# Patient Record
Sex: Male | Born: 1975 | Race: White | Hispanic: No | Marital: Single | Smoking: Current every day smoker
Health system: Southern US, Community
[De-identification: ages and names within clinical notes are randomized; demographics above are authoritative.]

## PROBLEM LIST (undated history)

## (undated) DIAGNOSIS — F419 Anxiety disorder, unspecified: Secondary | ICD-10-CM

---

## 2003-09-20 ENCOUNTER — Other Ambulatory Visit: Payer: Self-pay

## 2004-05-25 ENCOUNTER — Emergency Department: Payer: Self-pay | Admitting: Emergency Medicine

## 2004-12-02 ENCOUNTER — Other Ambulatory Visit: Payer: Self-pay

## 2004-12-02 ENCOUNTER — Inpatient Hospital Stay: Payer: Self-pay | Admitting: Internal Medicine

## 2005-08-17 ENCOUNTER — Emergency Department: Payer: Self-pay | Admitting: Emergency Medicine

## 2007-11-19 ENCOUNTER — Emergency Department: Payer: Self-pay | Admitting: Emergency Medicine

## 2007-11-22 ENCOUNTER — Emergency Department: Payer: Self-pay | Admitting: Emergency Medicine

## 2007-11-23 ENCOUNTER — Emergency Department: Payer: Self-pay | Admitting: Emergency Medicine

## 2008-07-05 ENCOUNTER — Emergency Department: Payer: Self-pay | Admitting: Emergency Medicine

## 2009-04-03 ENCOUNTER — Emergency Department: Payer: Self-pay | Admitting: Emergency Medicine

## 2009-09-20 ENCOUNTER — Emergency Department: Payer: Self-pay | Admitting: Emergency Medicine

## 2012-12-27 ENCOUNTER — Emergency Department: Payer: Self-pay | Admitting: Emergency Medicine

## 2012-12-27 LAB — URINALYSIS, COMPLETE
Bacteria: NONE SEEN
Bilirubin,UR: NEGATIVE
Ketone: NEGATIVE
Leukocyte Esterase: NEGATIVE
Protein: NEGATIVE
RBC,UR: NONE SEEN /HPF (ref 0–5)
Specific Gravity: 1.002 (ref 1.003–1.030)
Squamous Epithelial: <1

## 2012-12-27 LAB — GC/CHLAMYDIA PROBE AMP

## 2013-07-03 ENCOUNTER — Emergency Department: Payer: Self-pay | Admitting: Emergency Medicine

## 2013-07-03 LAB — CBC
HCT: 43.9 % (ref 40.0–52.0)
HGB: 14.2 g/dL (ref 13.0–18.0)
MCH: 32.1 pg (ref 26.0–34.0)
MCHC: 32.3 g/dL (ref 32.0–36.0)
MCV: 99 fL (ref 80–100)
Platelet: 198 10*3/uL (ref 150–440)
RBC: 4.42 10*6/uL (ref 4.40–5.90)
RDW: 13.2 % (ref 11.5–14.5)
WBC: 8.6 10*3/uL (ref 3.8–10.6)

## 2013-07-03 LAB — BASIC METABOLIC PANEL
Anion Gap: 7 (ref 7–16)
BUN: 6 mg/dL — AB (ref 7–18)
CALCIUM: 8.6 mg/dL (ref 8.5–10.1)
CHLORIDE: 103 mmol/L (ref 98–107)
CO2: 29 mmol/L (ref 21–32)
Creatinine: 0.72 mg/dL (ref 0.60–1.30)
EGFR (African American): >60
EGFR (Non-African Amer.): >60
GLUCOSE: 76 mg/dL (ref 65–99)
OSMOLALITY: 274 (ref 275–301)
Potassium: 3.5 mmol/L (ref 3.5–5.1)
Sodium: 139 mmol/L (ref 136–145)

## 2013-09-21 ENCOUNTER — Emergency Department: Payer: Self-pay | Admitting: Emergency Medicine

## 2015-02-15 ENCOUNTER — Emergency Department
Admission: EM | Admit: 2015-02-15 | Discharge: 2015-02-15 | Disposition: A | Discharge status: Home / Self Care | Payer: Self-pay | Attending: Emergency Medicine | Admitting: Emergency Medicine

## 2015-02-15 ENCOUNTER — Emergency Department: Payer: Self-pay

## 2015-02-15 DIAGNOSIS — R110 Nausea: Secondary | ICD-10-CM | POA: Insufficient documentation

## 2015-02-15 DIAGNOSIS — R079 Chest pain, unspecified: Secondary | ICD-10-CM | POA: Insufficient documentation

## 2015-02-15 DIAGNOSIS — R8299 Other abnormal findings in urine: Secondary | ICD-10-CM | POA: Insufficient documentation

## 2015-02-15 DIAGNOSIS — F17200 Nicotine dependence, unspecified, uncomplicated: Secondary | ICD-10-CM | POA: Insufficient documentation

## 2015-02-15 DIAGNOSIS — F419 Anxiety disorder, unspecified: Secondary | ICD-10-CM | POA: Insufficient documentation

## 2015-02-15 HISTORY — DX: Anxiety disorder, unspecified: F41.9

## 2015-02-15 LAB — CBC
HEMATOCRIT: 39.2 % — AB (ref 40.0–52.0)
Hemoglobin: 13.7 g/dL (ref 13.0–18.0)
MCH: 33.7 pg (ref 26.0–34.0)
MCHC: 35 g/dL (ref 32.0–36.0)
MCV: 96.1 fL (ref 80.0–100.0)
PLATELETS: 136 10*3/uL — AB (ref 150–440)
RBC: 4.08 MIL/uL — ABNORMAL LOW (ref 4.40–5.90)
RDW: 15.7 % — AB (ref 11.5–14.5)
WBC: 7.3 10*3/uL (ref 3.8–10.6)

## 2015-02-15 LAB — URINALYSIS COMPLETE WITH MICROSCOPIC (ARMC ONLY)
BACTERIA UA: NONE SEEN
Glucose, UA: NEGATIVE mg/dL
Hgb urine dipstick: NEGATIVE
KETONES UR: NEGATIVE mg/dL
LEUKOCYTES UA: NEGATIVE
NITRITE: NEGATIVE
PH: 5 (ref 5.0–8.0)
PROTEIN: 30 mg/dL — AB
SPECIFIC GRAVITY, URINE: 1.032 — AB (ref 1.005–1.030)

## 2015-02-15 LAB — TROPONIN I: Troponin I: <0.03 ng/mL (ref <0.031)

## 2015-02-15 LAB — BASIC METABOLIC PANEL
ANION GAP: 12 (ref 5–15)
BUN: 16 mg/dL (ref 6–20)
CALCIUM: 8.7 mg/dL — AB (ref 8.9–10.3)
CO2: 27 mmol/L (ref 22–32)
Chloride: 92 mmol/L — ABNORMAL LOW (ref 101–111)
Creatinine, Ser: 0.8 mg/dL (ref 0.61–1.24)
GFR calc Af Amer: >60 mL/min (ref >60)
GLUCOSE: 127 mg/dL — AB (ref 65–99)
POTASSIUM: 4.4 mmol/L (ref 3.5–5.1)
Sodium: 131 mmol/L — ABNORMAL LOW (ref 135–145)

## 2015-02-15 MED ORDER — LORAZEPAM 1 MG PO TABS: 1.0000 mg | ORAL_TABLET | Freq: Two times a day (BID) | ORAL | Status: DC

## 2015-02-15 NOTE — ED Notes (Signed)
Pt discharged with family.  Discharge teaching done.  Pt and family voiced understanding.  No questions or concerns at this time.  Pt in NAD.  Pt belongings with pt upon discharge.  No items left in ED.

## 2015-02-15 NOTE — ED Notes (Signed)
For 2 weeks, pt has had nausea and dizziness off and on. Today, pt c/o chest tightness and sob, as well as heartburn. Pt states that every time he eats, he gets nauseous. Today, he urinated "what looked like coffee."

## 2015-02-15 NOTE — Discharge Instructions (Signed)
Chest Wall Pain °Chest wall pain is pain in or around the bones and muscles of your chest. Sometimes, an injury causes this pain. Sometimes, the cause may not be known. This pain may take several weeks or longer to get better. °HOME CARE INSTRUCTIONS  °Pay attention to any changes in your symptoms. Take these actions to help with your pain:  °· Rest as told by your health care provider.   °· Avoid activities that cause pain. These include any activities that use your chest muscles or your abdominal and side muscles to lift heavy items.    °· If directed, apply ice to the painful area: °· Put ice in a plastic bag. °· Place a towel between your skin and the bag. °· Leave the ice on for 20 minutes, 2-3 times per day. °· Take over-the-counter and prescription medicines only as told by your health care provider. °· Do not use tobacco products, including cigarettes, chewing tobacco, and e-cigarettes. If you need help quitting, ask your health care provider. °· Keep all follow-up visits as told by your health care provider. This is important. °SEEK MEDICAL CARE IF: °· You have a fever. °· Your chest pain becomes worse. °· You have new symptoms. °SEEK IMMEDIATE MEDICAL CARE IF: °· You have nausea or vomiting. °· You feel sweaty or light-headed. °· You have a cough with phlegm (sputum) or you cough up blood. °· You develop shortness of breath. °  °This information is not intended to replace advice given to you by your health care provider. Make sure you discuss any questions you have with your health care provider. °  °Document Released: 03/04/2005 Document Revised: 11/23/2014 Document Reviewed: 05/30/2014 °Elsevier Interactive Patient Education ©2016 Elsevier Inc. ° °Panic Attacks °Panic attacks are sudden, short-lived surges of severe anxiety, fear, or discomfort. They may occur for no reason when you are relaxed, when you are anxious, or when you are sleeping. Panic attacks may occur for a number of reasons:  °· Healthy  people occasionally have panic attacks in extreme, life-threatening situations, such as war or natural disasters. Normal anxiety is a protective mechanism of the body that helps us react to danger (fight or flight response). °· Panic attacks are often seen with anxiety disorders, such as panic disorder, social anxiety disorder, generalized anxiety disorder, and phobias. Anxiety disorders cause excessive or uncontrollable anxiety. They may interfere with your relationships or other life activities. °· Panic attacks are sometimes seen with other mental illnesses, such as depression and posttraumatic stress disorder. °· Certain medical conditions, prescription medicines, and drugs of abuse can cause panic attacks. °SYMPTOMS  °Panic attacks start suddenly, peak within 20 minutes, and are accompanied by four or more of the following symptoms: °· Pounding heart or fast heart rate (palpitations). °· Sweating. °· Trembling or shaking. °· Shortness of breath or feeling smothered. °· Feeling choked. °· Chest pain or discomfort. °· Nausea or strange feeling in your stomach. °· Dizziness, light-headedness, or feeling like you will faint. °· Chills or hot flushes. °· Numbness or tingling in your lips or hands and feet. °· Feeling that things are not real or feeling that you are not yourself. °· Fear of losing control or going crazy. °· Fear of dying. °Some of these symptoms can mimic serious medical conditions. For example, you may think you are having a heart attack. Although panic attacks can be very scary, they are not life threatening. °DIAGNOSIS  °Panic attacks are diagnosed through an assessment by your health care provider. Your health care   provider will ask questions about your symptoms, such as where and when they occurred. Your health care provider will also ask about your medical history and use of alcohol and drugs, including prescription medicines. Your health care provider may order blood tests or other studies to  rule out a serious medical condition. Your health care provider may refer you to a mental health professional for further evaluation. °TREATMENT  °· Most healthy people who have one or two panic attacks in an extreme, life-threatening situation will not require treatment. °· The treatment for panic attacks associated with anxiety disorders or other mental illness typically involves counseling with a mental health professional, medicine, or a combination of both. Your health care provider will help determine what treatment is best for you. °· Panic attacks due to physical illness usually go away with treatment of the illness. If prescription medicine is causing panic attacks, talk with your health care provider about stopping the medicine, decreasing the dose, or substituting another medicine. °· Panic attacks due to alcohol or drug abuse go away with abstinence. Some adults need professional help in order to stop drinking or using drugs. °HOME CARE INSTRUCTIONS  °· Take all medicines as directed by your health care provider.   °· Schedule and attend follow-up visits as directed by your health care provider. It is important to keep all your appointments. °SEEK MEDICAL CARE IF: °· You are not able to take your medicines as prescribed. °· Your symptoms do not improve or get worse. °SEEK IMMEDIATE MEDICAL CARE IF:  °· You experience panic attack symptoms that are different than your usual symptoms. °· You have serious thoughts about hurting yourself or others. °· You are taking medicine for panic attacks and have a serious side effect. °MAKE SURE YOU: °· Understand these instructions. °· Will watch your condition. °· Will get help right away if you are not doing well or get worse. °  °This information is not intended to replace advice given to you by your health care provider. Make sure you discuss any questions you have with your health care provider. °  °Document Released: 03/04/2005 Document Revised: 03/09/2013  Document Reviewed: 10/16/2012 °Elsevier Interactive Patient Education ©2016 Elsevier Inc. ° °

## 2015-02-15 NOTE — ED Notes (Signed)
Patient with shortness of breath and chest tightness for 2 days.

## 2015-02-15 NOTE — ED Provider Notes (Signed)
[facility] Emergency Department Provider Note     Time seen: ----------------------------------------- 6:18 PM on 02/15/2015 -----------------------------------------    I have reviewed the triage vital signs and the nursing notes.   HISTORY  Chief Complaint Shortness of Breath    HPI Adam Cooper is a 39 y.o. male who presents to ER for nausea and dizziness off and on. Patient also had chest tightness and shortness of breath today. Patient states every time he eats he gets nauseous. Today he also states he urinated and look like coffee grounds. Nothing makes his symptoms better.   Past Medical History  Diagnosis Date  . Anxiety     There are no active problems to display for this patient.   History reviewed. No pertinent past surgical history.  Allergies Review of patient's allergies indicates no known allergies.  Social History Social History  Substance Use Topics  . Smoking status: Current Every Day Smoker  . Smokeless tobacco: None  . Alcohol Use: Yes    Review of Systems Constitutional: Negative for fever. Eyes: Negative for visual changes. ENT: Negative for sore throat. Cardiovascular: Positive for chest pain Respiratory: Positive for shortness of breath Gastrointestinal: Negative for abdominal pain, positive for nausea Genitourinary: Negative for dysuria. Positive for dark urine Musculoskeletal: Negative for back pain. Skin: Negative for rash. Neurological: Negative for headaches, focal weakness or numbness.  10-point ROS otherwise negative.  ____________________________________________   PHYSICAL EXAM:  VITAL SIGNS: ED Triage Vitals  Enc Vitals Group     BP 02/15/15 1705 124/91 mmHg     Pulse Rate 02/15/15 1705 99     Resp 02/15/15 1705 16     Temp 02/15/15 1705 98.3 F (36.8 C)     Temp Source 02/15/15 1705 Oral     SpO2 02/15/15 1705 99 %     Weight 02/15/15 1705 140 lb (63.504 kg)     Height 02/15/15  1705  (1.626 m)     Head Cir --      Peak Flow --      Pain Score --      Pain Loc --      Pain Edu? --      Excl. in GC? --     Constitutional: Alert and oriented. Well appearing and in no distress. Eyes: Conjunctivae are normal. PERRL. Normal extraocular movements. ENT   Head: Normocephalic and atraumatic.   Nose: No congestion/rhinnorhea.   Mouth/Throat: Mucous membranes are moist.   Neck: No stridor. Cardiovascular: Normal rate, regular rhythm. Normal and symmetric distal pulses are present in all extremities. No murmurs, rubs, or gallops. Respiratory: Normal respiratory effort without tachypnea nor retractions. Breath sounds are clear and equal bilaterally. No wheezes/rales/rhonchi. Gastrointestinal: Soft and nontender. No distention. No abdominal bruits.  Musculoskeletal: Nontender with normal range of motion in all extremities. No joint effusions.  No lower extremity tenderness nor edema. Neurologic:  Normal speech and language. No gross focal neurologic deficits are appreciated. Speech is normal. No gait instability. Skin:  Skin is warm, dry and intact. No rash noted. Psychiatric: Mildly anxious, speech and behavior are normal. ____________________________________________  EKG: Interpreted by me. Normal sinus rhythm rate of 95 bpm, normal PR interval, normal gross with, normal QT interval. Flat T waves.  ____________________________________________  ED COURSE:  Pertinent labs & imaging results that were available during my care of the patient were reviewed by me and considered in my medical decision making (see chart for details). Nonspecific symptoms, possible anxiety. We'll check  basic labs and reevaluate. ____________________________________________    LABS (pertinent positives/negatives)  Labs Reviewed  CBC - Abnormal; Notable for the following:    RBC 4.08 (*)    HCT 39.2 (*)    RDW 15.7 (*)    Platelets 136 (*)    All other components within  normal limits  BASIC METABOLIC PANEL - Abnormal; Notable for the following:    Sodium 131 (*)    Chloride 92 (*)    Glucose, Bld 127 (*)    Calcium 8.7 (*)    All other components within normal limits  URINALYSIS COMPLETEWITH MICROSCOPIC ([facility] ONLY) - Abnormal; Notable for the following:    Color, Urine AMBER (*)    APPearance CLEAR (*)    Bilirubin Urine 1+ (*)    Specific Gravity, Urine 1.032 (*)    Protein, ur 30 (*)    Squamous Epithelial / LPF 0-5 (*)    All other components within normal limits  TROPONIN I  TROPONIN I    RADIOLOGY  Chest x-ray IMPRESSION: No active cardiopulmonary disease. ____________________________________________  FINAL ASSESSMENT AND PLAN  Chest pain, dizziness  Plan: Patient with labs and imaging as dictated above. Patient is in no acute distress, labs here been unremarkable. Chest x-ray has also been normal. There is likely some anxiety component to this. He'll be discharged with Ativan to take as needed.   Emily FilbertWilliams, [name], MD   Emily FilbertJonathan E Cheronda Erck, MD 02/15/15 [phone]

## 2015-05-29 ENCOUNTER — Emergency Department
Admission: EM | Admit: 2015-05-29 | Discharge: 2015-05-29 | Disposition: A | Discharge status: Home / Self Care | Payer: BLUE CROSS/BLUE SHIELD | Attending: Emergency Medicine | Admitting: Emergency Medicine

## 2015-05-29 ENCOUNTER — Encounter: Payer: Self-pay | Admitting: *Deleted

## 2015-05-29 DIAGNOSIS — F419 Anxiety disorder, unspecified: Secondary | ICD-10-CM | POA: Diagnosis not present

## 2015-05-29 DIAGNOSIS — K1379 Other lesions of oral mucosa: Secondary | ICD-10-CM | POA: Diagnosis not present

## 2015-05-29 DIAGNOSIS — F17210 Nicotine dependence, cigarettes, uncomplicated: Secondary | ICD-10-CM | POA: Insufficient documentation

## 2015-05-29 DIAGNOSIS — J029 Acute pharyngitis, unspecified: Secondary | ICD-10-CM | POA: Diagnosis present

## 2015-05-29 MED ORDER — MAGIC MOUTHWASH W/LIDOCAINE: 5.0000 mL | Freq: Four times a day (QID) | ORAL | Status: AC | PRN

## 2015-05-29 MED ORDER — LORAZEPAM 0.5 MG PO TABS
0.5000 mg | ORAL_TABLET | Freq: Once | ORAL | Status: AC
  Administered 2015-05-29: 0.5 mg via ORAL

## 2015-05-29 MED ORDER — LORAZEPAM 0.5 MG PO TABS: 0.5000 mg | ORAL_TABLET | Freq: Two times a day (BID) | ORAL | Status: AC | PRN

## 2015-05-29 NOTE — ED Notes (Signed)
Pt walked into triage. States "my hangy thing feels like it is choking me.  Pt maintaining airway and secretions. Will not let RN visualize throat"

## 2015-05-29 NOTE — ED Notes (Addendum)
See triage note.states sore throat today  Increased discomfort with swallowing  Also states his anxiety is bad this am  Feels like he is unable to breath  No resp distress noted at this am

## 2015-05-29 NOTE — Discharge Instructions (Signed)
[facility]  Generalized Anxiety Disorder Generalized anxiety disorder (GAD) is a mental disorder. It interferes with life functions, including relationships, work, and school. GAD is different from normal anxiety, which everyone experiences at some point in their lives in response to specific life events and activities. Normal anxiety actually helps us prepare for and get through these life events and activities. Normal anxiety goes away after the event or activity is over.  GAD causes anxiety that is not necessarily related to specific events or activities. It also causes excess anxiety in proportion to specific events or activities. The anxiety associated with GAD is also difficult to control. GAD can vary from mild to severe. People with severe GAD can have intense waves of anxiety with physical symptoms (panic attacks).  SYMPTOMS The anxiety and worry associated with GAD are difficult to control. This anxiety and worry are related to many life events and activities and also occur more days than not for 6 months or longer. People with GAD also have three or more of the following symptoms (one or more in children):  Restlessness.   Fatigue.  Difficulty concentrating.   Irritability.  Muscle tension.  Difficulty sleeping or unsatisfying sleep. DIAGNOSIS GAD is diagnosed through an assessment by your health care provider. Your health care provider will ask you questions aboutyour mood,physical symptoms, and events in your life. Your health care provider may ask you about your medical history and use of alcohol or drugs, including prescription medicines. Your health care provider may also do a physical exam and blood tests. Certain medical conditions and the use of certain substances can cause symptoms similar to those associated with GAD. Your health care provider may refer you to a mental health specialist for further  evaluation. TREATMENT The following therapies are usually used to treat GAD:   Medication. Antidepressant medication usually is prescribed for long-term daily control. Antianxiety medicines may be added in severe cases, especially when panic attacks occur.   Talk therapy (psychotherapy). Certain types of talk therapy can be helpful in treating GAD by providing support, education, and guidance. A form of talk therapy called cognitive behavioral therapy can teach you healthy ways to think about and react to daily life events and activities.  Stress managementtechniques. These include yoga, meditation, and exercise and can be very helpful when they are practiced regularly. A mental health specialist can help determine which treatment is best for you. Some people see improvement with one therapy. However, other people require a combination of therapies.   This information is not intended to replace advice given to you by your health care provider. Make sure you discuss any questions you have with your health care provider.   Document Released: 06/29/2012 Document Revised: 03/25/2014 Document Reviewed: 06/29/2012 Elsevier Interactive Patient Education [employer]2016 Elsevier Inc.

## 2015-05-29 NOTE — ED Notes (Signed)
Pt complains of sore throat starting today, pt reports anxiety

## 2015-05-29 NOTE — ED Provider Notes (Signed)
[facility] Emergency Department Provider Note  ____________________________________________  Time seen: Approximately 12:49 PM  I have reviewed the triage vital signs and the nursing notes.   HISTORY  Chief Complaint Sore Throat    HPI Adam Cooper is a 40 y.o. male, NAD, reports the emergency department with a few hours history of increased anxiety and uvula swelling. Patient notes he woke this morning feeling like his uvula was swollen and causing him to choke. Has some sore throat but no overt pain. Has not had any fevers, chills, body aches. Does note his wife states he snores at night. Did drink 6 beers last night before going to bed. States this is not usual for him. Has not had any difficulty breathing or swallowing due to swelling. States the sensation has caused his anxiety to increase significantly. He was treated in this emergency department a couple of months ago for anxiety and given Ativan. He has not followed up with her primary care provider since that time as instructed.Denies chest pain, shortness breath, wheezing.   Past Medical History  Diagnosis Date  . Anxiety     There are no active problems to display for this patient.   History reviewed. No pertinent past surgical history.  Current Outpatient Rx  Name  Route  Sig  Dispense  Refill  . LORazepam (ATIVAN) 0.5 MG tablet   Oral   Take 1 tablet (0.5 mg total) by mouth 2 (two) times daily as needed for anxiety.   6 tablet   0   . magic mouthwash w/lidocaine SOLN   Oral   Take 5 mLs by mouth 4 (four) times daily as needed for mouth pain.   240 mL   0     Please mix 80mL diphenhydramine, 80mL nystatin, 80 ...     Allergies Review of patient's allergies indicates no known allergies.  No family history on file.  Social History Social History  Substance Use Topics  . Smoking status: Current Every Day Smoker -- 1.00 packs/day    Types: Cigarettes  . Smokeless tobacco:  None  . Alcohol Use: Yes     Review of Systems  Constitutional: No fever/chills Eyes: No visual changes. No discharge ENT: Positive sore throat, uvula swelling. No ear pain, nasal congestion, sneezing, runny nose. Cardiovascular: No chest pain. Respiratory: No cough. No shortness of breath. No wheezing.  Gastrointestinal: No abdominal pain.  No nausea, vomiting.   Musculoskeletal: Negative for general myalgias.  Skin: Negative for rash. Neurological: Negative for headaches, focal weakness or numbness. Psychological:  Positive anxiety.  10-point ROS otherwise negative.  ____________________________________________   PHYSICAL EXAM:  VITAL SIGNS: ED Triage Vitals  Enc Vitals Group     BP 05/29/15 1155 148/92 mmHg     Pulse Rate 05/29/15 1155 114     Resp 05/29/15 1155 20     Temp 05/29/15 1155 97.8 F (36.6 C)     Temp Source 05/29/15 1155 Oral     SpO2 05/29/15 1155 97 %     Weight 05/29/15 1155 140 lb (63.504 kg)     Height 05/29/15 1155 5\' 3"  (1.6 m)     Head Cir --      Peak Flow --      Pain Score --      Pain Loc --      Pain Edu? --      Excl. in GC? --     Constitutional: Alert and oriented. Well appearing and in no  acute distress. Eyes: Conjunctivae are normal. PERRL. Head: Atraumatic. ENT:      Ears: TMs visualized bilaterally without effusion, erythema, bulging, perforation.      Nose: No congestion/rhinnorhea.      Mouth/Throat: Mild injection of posterior pharynx and uvula. No significant swelling noted. Uvula uvula is slender and does hang above posterior tongue by approximately 3 mm. Mucous membranes are moist.  Neck: No stridor. Supple with full range of motion. Hematological/Lymphatic/Immunilogical: No cervical lymphadenopathy. Cardiovascular: Normal rate, regular rhythm. Normal S1 and S2.  Good peripheral circulation. Respiratory: Normal respiratory effort without tachypnea or retractions. Lungs CTAB. Neurologic:  Normal speech and language. No  gross focal neurologic deficits are appreciated.  Skin:  Skin is warm, dry and intact. No rash noted. Psychiatric: Mood is anxious but isect are normal. Speech and behavior are normal. Patient exhibits appropriate insight and judgement.   ____________________________________________   LABS (all labs ordered are listed, but only abnormal results are displayed)  Labs Reviewed - No data to display ____________________________________________  EKG  None ____________________________________________  RADIOLOGY  None  ____________________________________________    PROCEDURES  Procedure(s) performed: None    Medications  LORazepam (ATIVAN) tablet 0.5 mg (0.5 mg Oral Given 05/29/15 1302)   Patient with visibly decreased anxiety. He is being picked up by his wife after discharge.   ____________________________________________   INITIAL IMPRESSION / ASSESSMENT AND PLAN / ED COURSE  Pertinent lab results that were available during my care of the patient were reviewed by me and considered in my medical decision making (see chart for details).  Patient's diagnosis is consistent with anxiety and acquired elongated uvula due to snoring and alcohol consumption. Patient will be discharged home with prescriptions for Magic mouthwash and Ativan to use as directed. Patient is to follow up with [facility] to establish care and follow-up on today's visit. Patient is given ED precautions to return to the ED for any worsening or new symptoms.      ____________________________________________  FINAL CLINICAL IMPRESSION(S) / ED DIAGNOSES  Final diagnoses:  Anxiety  Elongated uvula, acquired      NEW MEDICATIONS STARTED DURING THIS VISIT:  New Prescriptions   LORAZEPAM (ATIVAN) 0.5 MG TABLET    Take 1 tablet (0.5 mg total) by mouth 2 (two) times daily as needed for anxiety.   MAGIC MOUTHWASH W/LIDOCAINE SOLN    Take 5 mLs by mouth 4 (four) times daily as needed for mouth  pain.         [name], PA-C 05/29/15 1323  Jene Every, MD 05/29/15 1332

## 2015-05-29 NOTE — ED Notes (Signed)
Strep negative.

## 2016-10-27 ENCOUNTER — Encounter: Payer: Self-pay | Admitting: Emergency Medicine

## 2016-10-27 ENCOUNTER — Emergency Department
Admission: EM | Admit: 2016-10-27 | Discharge: 2016-10-27 | Disposition: A | Discharge status: Home / Self Care | Payer: BLUE CROSS/BLUE SHIELD | Attending: Emergency Medicine | Admitting: Emergency Medicine

## 2016-10-27 DIAGNOSIS — F17210 Nicotine dependence, cigarettes, uncomplicated: Secondary | ICD-10-CM | POA: Insufficient documentation

## 2016-10-27 DIAGNOSIS — L03211 Cellulitis of face: Secondary | ICD-10-CM | POA: Insufficient documentation

## 2016-10-27 DIAGNOSIS — R590 Localized enlarged lymph nodes: Secondary | ICD-10-CM | POA: Insufficient documentation

## 2016-10-27 DIAGNOSIS — K130 Diseases of lips: Secondary | ICD-10-CM

## 2016-10-27 MED ORDER — SULFAMETHOXAZOLE-TRIMETHOPRIM 800-160 MG PO TABS: 1.0000 | ORAL_TABLET | Freq: Two times a day (BID) | ORAL | 0 refills | Status: AC

## 2016-10-27 MED ORDER — PREDNISONE 10 MG (21) PO TBPK: ORAL_TABLET | ORAL | 0 refills | Status: DC

## 2016-10-27 MED ORDER — HYDROCODONE-ACETAMINOPHEN 5-325 MG PO TABS: 1.0000 | ORAL_TABLET | ORAL | 0 refills | Status: AC | PRN

## 2016-10-27 NOTE — ED Notes (Signed)

## 2016-10-27 NOTE — ED Provider Notes (Signed)
[facility] Emergency Department Provider Note ____________________________________________  Time seen: Approximately 3:08 PM  I have reviewed the triage vital signs and the nursing notes.   HISTORY  Chief Complaint Oral Swelling   HPI Adam Cooper is a 41 y.o. male who presents to the emergency department for evaluation of left side lower lip swelling. Pain started 3-4 days agoand started swelling yesterday. No associated dental pain. No specific injury. He also denies fever.  Past Medical History:  Diagnosis Date  . Anxiety     There are no active problems to display for this patient.   History reviewed. No pertinent surgical history.  Prior to Admission medications   Medication Sig Start Date End Date Taking? Authorizing Provider  HYDROcodone-acetaminophen (NORCO/VICODIN) 5-325 MG tablet Take 1 tablet by mouth every 4 (four) hours as needed for moderate pain. 10/27/16 10/27/17  Rik Wadel, Kasandra Knudsen, FNP  LORazepam (ATIVAN) 0.5 MG tablet Take 1 tablet (0.5 mg total) by mouth 2 (two) times daily as needed for anxiety. 05/29/15   Hagler, Jami L, PA-C  magic mouthwash w/lidocaine SOLN Take 5 mLs by mouth 4 (four) times daily as needed for mouth pain. 05/29/15   Hagler, Jami L, PA-C  predniSONE (STERAPRED UNI-PAK 21 TAB) 10 MG (21) TBPK tablet Take 6 tablets on day 1 Take 5 tablets on day 2 Take 4 tablets on day 3 Take 3 tablets on day 4 Take 2 tablets on day 5 Take 1 tablet on day 6 10/27/16   Aviyah Swetz B, FNP  sulfamethoxazole-trimethoprim (BACTRIM DS,SEPTRA DS) 800-160 MG tablet Take 1 tablet by mouth 2 (two) times daily. 10/27/16   Chinita Pester, FNP    Allergies Patient has no known allergies.  History reviewed. No pertinent family history.  Social History Social History  Substance Use Topics  . Smoking status: Current Every Day Smoker    Packs/day: 1.00    Types: Cigarettes  . Smokeless tobacco: Never Used  . Alcohol use Yes     Review of Systems Constitutional: Negative for fever. ENT: Positive for lower lip swelling. Musculoskeletal: Negative for myalgias.  Skin: Positive for lower lip swelling. ____________________________________________   PHYSICAL EXAM:  VITAL SIGNS: ED Triage Vitals  Enc Vitals Group     BP --      Pulse --      Resp --      Temp --      Temp src --      SpO2 --      Weight 10/27/16 1452 140 lb (63.5 kg)     Height 10/27/16 1452 5\' 3"  (1.6 m)     Head Circumference --      Peak Flow --      Pain Score 10/27/16 1450 10     Pain Loc --      Pain Edu? --      Excl. in GC? --     Constitutional: Alert and oriented. Well appearing and in no acute distress. Eyes: Conjunctivae are without discharge or drainage. Mouth/Throat: Left side lower lip swelling without oral lesion. Sublingual surface is soft. No obvious dental abscess. Swelling is localized to the lip. Airway is patent. Tongue normal in size.  Periodontal Exam Hematological/Lymphatic/Immunilogical: Anterior cervical node is tender on the left. Respiratory: Even and unlabored. Musculoskeletal: Full, active ROM throughout--specifically of the jaw. Neurologic: Awake, alert, and oriented x 4.  Skin:  Warm and dry without lesion Psychiatric: Affect and behavior are normal.  ____________________________________________   LABS (  all labs ordered are listed, but only abnormal results are displayed)  Labs Reviewed - No data to display ____________________________________________   RADIOLOGY  Not indicated. ____________________________________________   PROCEDURES  Procedure(s) performed: None  Critical Care performed: No ____________________________________________   INITIAL IMPRESSION / ASSESSMENT AND PLAN / ED COURSE  Ignacia PalmaMichael S Cooper is a 41 y.o. male who presents to the emergency department for evaluation and treatment of lip swelling. He will be treated with bactrim, prednisone, and norco. He was  instructed to return to the ER for symptoms that change or worsen. He was instructed to return to the ER for symptoms that are not improving over the next 2 days.  Pertinent labs & imaging results that were available during my care of the patient were reviewed by me and considered in my medical decision making (see chart for details).  ____________________________________________   FINAL CLINICAL IMPRESSION(S) / ED DIAGNOSES  Final diagnoses:  Cellulitis of skin of lip  Lymphadenopathy, anterior cervical    Discharge Medication List as of 10/27/2016  3:39 PM    START taking these medications   Details  HYDROcodone-acetaminophen (NORCO/VICODIN) 5-325 MG tablet Take 1 tablet by mouth every 4 (four) hours as needed for moderate pain., Starting Sun 10/27/2016, Until Mon 10/27/2017, Print    predniSONE (STERAPRED UNI-PAK 21 TAB) 10 MG (21) TBPK tablet Take 6 tablets on day 1 Take 5 tablets on day 2 Take 4 tablets on day 3 Take 3 tablets on day 4 Take 2 tablets on day 5 Take 1 tablet on day 6, Print    sulfamethoxazole-trimethoprim (BACTRIM DS,SEPTRA DS) 800-160 MG tablet Take 1 tablet by mouth 2 (two) times daily., Starting Sun 10/27/2016, Print        If controlled substance prescribed during this visit, 12 month history viewed on the [place] prior to issuing an initial prescription for Schedule II or III opiod.  Note:  This document was prepared using Dragon voice recognition software and may include unintentional dictation errors.    Chinita Pesterriplett, Mairi Stagliano B, FNP 10/29/16 1842    Minna AntisPaduchowski, Kevin, MD 10/31/16 [phone]

## 2016-10-27 NOTE — ED Triage Notes (Signed)
Pt states on Thursday started having pain to left lower lip, states started to swell. C/o pain to the left side of his lower lip and is unable to brush his teeth or shave without having pain. Pt is not on any new medications or tried any new foods. Pt states he has tried warm compress but it makes it throb. Denies any injury.

## 2016-10-27 NOTE — Discharge Instructions (Signed)
Please return to the emergency department for symptoms that are not improving over the next 2 days or sooner for symptoms that change or worsen.

## 2016-10-28 ENCOUNTER — Encounter: Payer: Self-pay | Admitting: *Deleted

## 2016-10-28 DIAGNOSIS — K130 Diseases of lips: Secondary | ICD-10-CM | POA: Insufficient documentation

## 2016-10-28 NOTE — ED Triage Notes (Signed)
Pt has abscess to lower lip.  Pt was seen here yesterday for same sx .  Tonight pt stuck a pin in lip and drained lip, but swelling more and pain worse.  No resp distress.  Pt alert.

## 2016-10-29 ENCOUNTER — Emergency Department
Admission: EM | Admit: 2016-10-29 | Discharge: 2016-10-29 | Discharge status: Against Medical Advice | Payer: BLUE CROSS/BLUE SHIELD | Attending: Emergency Medicine | Admitting: Emergency Medicine

## 2016-10-31 ENCOUNTER — Emergency Department
Admission: EM | Admit: 2016-10-31 | Discharge: 2016-10-31 | Disposition: A | Discharge status: Home / Self Care | Payer: BLUE CROSS/BLUE SHIELD | Attending: Emergency Medicine | Admitting: Emergency Medicine

## 2016-10-31 ENCOUNTER — Encounter: Payer: Self-pay | Admitting: Emergency Medicine

## 2016-10-31 DIAGNOSIS — L0291 Cutaneous abscess, unspecified: Secondary | ICD-10-CM

## 2016-10-31 DIAGNOSIS — F17210 Nicotine dependence, cigarettes, uncomplicated: Secondary | ICD-10-CM | POA: Insufficient documentation

## 2016-10-31 DIAGNOSIS — K130 Diseases of lips: Secondary | ICD-10-CM | POA: Insufficient documentation

## 2016-10-31 MED ORDER — CLINDAMYCIN HCL 300 MG PO CAPS: 300.0000 mg | ORAL_CAPSULE | Freq: Four times a day (QID) | ORAL | 0 refills | Status: AC

## 2016-10-31 MED ORDER — CLINDAMYCIN PHOSPHATE 300 MG/2ML IJ SOLN
600.0000 mg | Freq: Once | INTRAMUSCULAR | Status: AC
  Administered 2016-10-31: 900 mg via INTRAMUSCULAR
  Filled 2016-10-31: qty 4

## 2016-10-31 MED ORDER — LIDOCAINE-EPINEPHRINE-TETRACAINE (LET) SOLUTION
3.0000 mL | Freq: Once | NASAL | Status: AC
  Administered 2016-10-31: 3 mL via TOPICAL
  Filled 2016-10-31: qty 3

## 2016-10-31 MED ORDER — LIDOCAINE VISCOUS 2 % MT SOLN: 10.0000 mL | OROMUCOSAL | 0 refills | Status: AC | PRN

## 2016-10-31 MED ORDER — CLINDAMYCIN PHOSPHATE 600 MG/4ML IJ SOLN
600.0000 mg | Freq: Once | INTRAMUSCULAR | Status: DC
  Filled 2016-10-31: qty 4

## 2016-10-31 MED ORDER — PREDNISONE 10 MG PO TABS: ORAL_TABLET | ORAL | 0 refills | Status: AC

## 2016-10-31 MED ORDER — PREDNISONE 20 MG PO TABS
30.0000 mg | ORAL_TABLET | Freq: Once | ORAL | Status: AC
  Administered 2016-10-31: 30 mg via ORAL
  Filled 2016-10-31: qty 1

## 2016-10-31 NOTE — ED Notes (Addendum)
See triage note.  Was seen on Sunday for swelling to lower lip   Placed on antibiotics and steroids at that time   States lower lip is more swollen today

## 2016-10-31 NOTE — ED Provider Notes (Signed)
[facility] Emergency Department Provider Note  ____________________________________________  Time seen: Approximately 6:15 PM  I have reviewed the triage vital signs and the nursing notes.   HISTORY  Chief Complaint Oral Swelling    HPI Adam Cooper is a 41 y.o. male presents to the emergency department with left sided lower lip swelling for 1 week.  Patient was seen in the emergency department 4 days ago and was given antibiotics and steroids. He felt both prescriptions but not all of the steroid pills were in the bottle.He is still taking the Bactrim but does not have any more steroids. Swelling and pain have not improved. This morning lip started draining some yellow discharge. No fever, shortness breath, chest pain, nausea, vomiting, abdominal pain.   Past Medical History:  Diagnosis Date  . Anxiety     There are no active problems to display for this patient.   History reviewed. No pertinent surgical history.  Prior to Admission medications   Medication Sig Start Date End Date Taking? Authorizing Provider  clindamycin (CLEOCIN) 300 MG capsule Take 1 capsule (300 mg total) by mouth 4 (four) times daily. 10/31/16 11/10/16  Enid Derry, PA-C  HYDROcodone-acetaminophen (NORCO/VICODIN) 5-325 MG tablet Take 1 tablet by mouth every 4 (four) hours as needed for moderate pain. 10/27/16 10/27/17  Triplett, Rulon Eisenmenger B, FNP  lidocaine (XYLOCAINE) 2 % solution Use as directed 10 mLs in the mouth or throat as needed for mouth pain. 10/31/16   Enid Derry, PA-C  LORazepam (ATIVAN) 0.5 MG tablet Take 1 tablet (0.5 mg total) by mouth 2 (two) times daily as needed for anxiety. 05/29/15   Hagler, Jami L, PA-C  magic mouthwash w/lidocaine SOLN Take 5 mLs by mouth 4 (four) times daily as needed for mouth pain. 05/29/15   Hagler, Jami L, PA-C  predniSONE (DELTASONE) 10 MG tablet Take 20mg  (2 tablets) on 8/17. Take 10mg  (1 tablet) on 8/18 10/31/16   Enid Derry, PA-C   sulfamethoxazole-trimethoprim (BACTRIM DS,SEPTRA DS) 800-160 MG tablet Take 1 tablet by mouth 2 (two) times daily. 10/27/16   Chinita Pester, FNP    Allergies Patient has no known allergies.  No family history on file.  Social History Social History  Substance Use Topics  . Smoking status: Current Every Day Smoker    Packs/day: 0.50    Types: Cigarettes  . Smokeless tobacco: Never Used  . Alcohol use Yes     Review of Systems  Constitutional: No fever/chills Cardiovascular: No chest pain. Respiratory:  No SOB. Gastrointestinal: No abdominal pain.  No nausea, no vomiting.  Neurological: Negative for headaches, numbness or tingling   ____________________________________________   PHYSICAL EXAM:  VITAL SIGNS: ED Triage Vitals  Enc Vitals Group     BP 10/31/16 1337 (!) 149/92     Pulse Rate 10/31/16 1337 86     Resp 10/31/16 1337 18     Temp 10/31/16 1337 98.3 F (36.8 C)     Temp Source 10/31/16 1337 Oral     SpO2 10/31/16 1337 100 %     Weight 10/31/16 1338 150 lb (68 kg)     Height 10/31/16 1338 5\' 4"  (1.626 m)     Head Circumference --      Peak Flow --      Pain Score 10/31/16 1337 10     Pain Loc --      Pain Edu? --      Excl. in GC? --      Constitutional: Alert  and oriented. Well appearing and in no acute distress. Eyes: Conjunctivae are normal. PERRL. EOMI. Head: Atraumatic. ENT:      Ears:      Nose: No congestion/rhinnorhea.      Mouth/Throat: Mucous membranes are moist. Moderate swelling of the left side of lower lip that is tender to palpation. Yellow drainage from center of swelling.  Neck: No stridor.   Cardiovascular: Normal rate, regular rhythm.  Good peripheral circulation. Respiratory: Normal respiratory effort without tachypnea or retractions. Lungs CTAB. Good air entry to the bases with no decreased or absent breath sounds. Musculoskeletal: Full range of motion to all extremities. No gross deformities appreciated. Neurologic:   Normal speech and language. No gross focal neurologic deficits are appreciated.  Skin:  Skin is warm, dry and intact.    ____________________________________________   LABS (all labs ordered are listed, but only abnormal results are displayed)  Labs Reviewed - No data to display ____________________________________________  EKG   ____________________________________________  RADIOLOGY  No results found.  ____________________________________________    PROCEDURES  Procedure(s) performed:    Procedures    Medications  lidocaine-EPINEPHrine-tetracaine (LET) solution (3 mLs Topical Given 10/31/16 1434)  clindamycin (CLEOCIN) injection 600 mg (900 mg Intramuscular Given 10/31/16 1450)  predniSONE (DELTASONE) tablet 30 mg (30 mg Oral Given 10/31/16 1537)     ____________________________________________   INITIAL IMPRESSION / ASSESSMENT AND PLAN / ED COURSE  Pertinent labs & imaging results that were available during my care of the patient were reviewed by me and considered in my medical decision making (see chart for details).  Review of the [place] CSRS was performed in accordance of the [facility] prior to dispensing any controlled drugs.   She presented to the emergency department for evaluation of lower lip swelling. Diagnosis is consistent with abscess. Lip started draining while in the emergency department. We discussed I&D but patient would like to hold off at this time now that abscess is draining. He was given a shot of IM clindamycin. Patient will be discharged home with prescriptions for clindamycin. He is still taking Bactrim. I will also give him a prescription so that he can take the rest of the prednisone that was prescribed to him 4 days ago. Patient is to follow up with PCP as directed. Patient is given ED precautions to return to the ED for any worsening or new symptoms.     ____________________________________________  FINAL CLINICAL IMPRESSION(S) / ED  DIAGNOSES  Final diagnoses:  Abscess      NEW MEDICATIONS STARTED DURING THIS VISIT:  Discharge Medication List as of 10/31/2016  3:21 PM    START taking these medications   Details  clindamycin (CLEOCIN) 300 MG capsule Take 1 capsule (300 mg total) by mouth 4 (four) times daily., Starting Thu 10/31/2016, Until Sun 11/10/2016, Print    lidocaine (XYLOCAINE) 2 % solution Use as directed 10 mLs in the mouth or throat as needed for mouth pain., Starting Thu 10/31/2016, Print    predniSONE (DELTASONE) 10 MG tablet Take 20mg  (2 tablets) on 8/17. Take 10mg  (1 tablet) on 8/18, Print            This chart was dictated using voice recognition software/Dragon. Despite best efforts to proofread, errors can occur which can change the meaning. Any change was purely unintentional.    Enid DerryWagner, Aemon Koeller, PA-C 10/31/16 1820    Loleta RoseForbach, Cory, MD 10/31/16 2056

## 2016-10-31 NOTE — ED Triage Notes (Signed)
Pt in via POV with complaints of pain/swelling to bottom lip x one week.  Pt reports being seen here on Sunday for same, given Bactrim which he has been taking, advised to return in two days if no better.  Pt reports swelling/pain is worse.  Pt afebrile, NAD noted at this time.

## 2018-06-06 ENCOUNTER — Encounter: Payer: Self-pay | Admitting: Emergency Medicine

## 2018-06-06 ENCOUNTER — Other Ambulatory Visit: Payer: Self-pay

## 2018-06-06 ENCOUNTER — Emergency Department: Payer: No Typology Code available for payment source

## 2018-06-06 ENCOUNTER — Emergency Department
Admission: EM | Admit: 2018-06-06 | Discharge: 2018-06-06 | Disposition: A | Discharge status: Home / Self Care | Payer: No Typology Code available for payment source | Attending: Emergency Medicine | Admitting: Emergency Medicine

## 2018-06-06 DIAGNOSIS — Y929 Unspecified place or not applicable: Secondary | ICD-10-CM | POA: Diagnosis not present

## 2018-06-06 DIAGNOSIS — Y999 Unspecified external cause status: Secondary | ICD-10-CM | POA: Insufficient documentation

## 2018-06-06 DIAGNOSIS — F17210 Nicotine dependence, cigarettes, uncomplicated: Secondary | ICD-10-CM | POA: Insufficient documentation

## 2018-06-06 DIAGNOSIS — S0990XA Unspecified injury of head, initial encounter: Secondary | ICD-10-CM | POA: Diagnosis present

## 2018-06-06 DIAGNOSIS — S060X9A Concussion with loss of consciousness of unspecified duration, initial encounter: Secondary | ICD-10-CM | POA: Diagnosis not present

## 2018-06-06 DIAGNOSIS — Y939 Activity, unspecified: Secondary | ICD-10-CM | POA: Insufficient documentation

## 2018-06-06 LAB — BASIC METABOLIC PANEL
Anion gap: 12 (ref 5–15)
BUN: 18 mg/dL (ref 6–20)
CO2: 23 mmol/L (ref 22–32)
Calcium: 9.1 mg/dL (ref 8.9–10.3)
Chloride: 100 mmol/L (ref 98–111)
Creatinine, Ser: 0.69 mg/dL (ref 0.61–1.24)
GFR calc non Af Amer: >60 mL/min (ref >60)
Glucose, Bld: 74 mg/dL (ref 70–99)
Potassium: 4.1 mmol/L (ref 3.5–5.1)
SODIUM: 135 mmol/L (ref 135–145)

## 2018-06-06 LAB — CBC
HCT: 38.6 % — ABNORMAL LOW (ref 39.0–52.0)
Hemoglobin: 13.2 g/dL (ref 13.0–17.0)
MCH: 32.3 pg (ref 26.0–34.0)
MCHC: 34.2 g/dL (ref 30.0–36.0)
MCV: 94.4 fL (ref 80.0–100.0)
Platelets: 222 10*3/uL (ref 150–400)
RBC: 4.09 MIL/uL — ABNORMAL LOW (ref 4.22–5.81)
RDW: 11.9 % (ref 11.5–15.5)
WBC: 7.9 10*3/uL (ref 4.0–10.5)
nRBC: 0 % (ref 0.0–0.2)

## 2018-06-06 MED ORDER — SODIUM CHLORIDE 0.9% FLUSH: 3.0000 mL | Freq: Once | INTRAVENOUS | Status: DC
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
## 2018-06-06 NOTE — Discharge Instructions (Addendum)
Please seek medical attention for any high fevers, chest pain, shortness of breath, change in behavior, persistent vomiting, bloody stool or any other new or concerning symptoms.  
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
## 2018-06-06 NOTE — ED Notes (Signed)
C-collar applied at this time.

## 2018-06-06 NOTE — ED Notes (Signed)
Pt attempting to find a ride at this time.

## 2019-01-29 ENCOUNTER — Emergency Department
Admission: EM | Admit: 2019-01-29 | Discharge: 2019-01-29 | Discharge status: Against Medical Advice | Payer: BLUE CROSS/BLUE SHIELD

## 2019-06-02 ENCOUNTER — Emergency Department: Payer: Self-pay

## 2019-06-02 ENCOUNTER — Emergency Department
Admission: EM | Admit: 2019-06-02 | Discharge: 2019-06-02 | Disposition: A | Discharge status: Home / Self Care | Payer: Self-pay | Attending: Emergency Medicine | Admitting: Emergency Medicine

## 2019-06-02 ENCOUNTER — Encounter: Payer: Self-pay | Admitting: Emergency Medicine

## 2019-06-02 ENCOUNTER — Other Ambulatory Visit: Payer: Self-pay

## 2019-06-02 DIAGNOSIS — T50901A Poisoning by unspecified drugs, medicaments and biological substances, accidental (unintentional), initial encounter: Secondary | ICD-10-CM

## 2019-06-02 DIAGNOSIS — T424X1A Poisoning by benzodiazepines, accidental (unintentional), initial encounter: Secondary | ICD-10-CM | POA: Insufficient documentation

## 2019-06-02 LAB — CBC WITH DIFFERENTIAL/PLATELET
Abs Immature Granulocytes: 0.03 10*3/uL (ref 0.00–0.07)
Basophils Absolute: 0 10*3/uL (ref 0.0–0.1)
Basophils Relative: 0 %
Eosinophils Absolute: 0.1 10*3/uL (ref 0.0–0.5)
Eosinophils Relative: 1 %
HCT: 40.3 % (ref 39.0–52.0)
Hemoglobin: 13.3 g/dL (ref 13.0–17.0)
Immature Granulocytes: 1 %
Lymphocytes Relative: 47 %
Lymphs Abs: 3 10*3/uL (ref 0.7–4.0)
MCH: 31.4 pg (ref 26.0–34.0)
MCHC: 33 g/dL (ref 30.0–36.0)
MCV: 95.3 fL (ref 80.0–100.0)
Monocytes Absolute: 0.5 10*3/uL (ref 0.1–1.0)
Monocytes Relative: 8 %
Neutro Abs: 2.8 10*3/uL (ref 1.7–7.7)
Neutrophils Relative %: 43 %
Platelets: 218 10*3/uL (ref 150–400)
RBC: 4.23 MIL/uL (ref 4.22–5.81)
RDW: 11.9 % (ref 11.5–15.5)
WBC: 6.4 10*3/uL (ref 4.0–10.5)
nRBC: 0 % (ref 0.0–0.2)

## 2019-06-02 LAB — BASIC METABOLIC PANEL
Anion gap: 8 (ref 5–15)
BUN: 11 mg/dL (ref 6–20)
CO2: 28 mmol/L (ref 22–32)
Calcium: 8.6 mg/dL — ABNORMAL LOW (ref 8.9–10.3)
Chloride: 106 mmol/L (ref 98–111)
Creatinine, Ser: 0.94 mg/dL (ref 0.61–1.24)
GFR calc Af Amer: >60 mL/min (ref >60)
GFR calc non Af Amer: >60 mL/min (ref >60)
Glucose, Bld: 214 mg/dL — ABNORMAL HIGH (ref 70–99)
Potassium: 3.7 mmol/L (ref 3.5–5.1)
Sodium: 142 mmol/L (ref 135–145)

## 2019-06-02 LAB — ETHANOL: Alcohol, Ethyl (B): 107 mg/dL — ABNORMAL HIGH (ref <10)

## 2019-06-02 MED ORDER — SODIUM CHLORIDE 0.9 % IV BOLUS
1000.0000 mL | Freq: Once | INTRAVENOUS | Status: AC
  Administered 2019-06-02: 1000 mL via INTRAVENOUS

## 2019-06-02 MED ORDER — NALOXONE HCL 2 MG/2ML IJ SOSY
0.5000 mg | PREFILLED_SYRINGE | Freq: Once | INTRAMUSCULAR | Status: AC
  Administered 2019-06-02: 0.5 mg via INTRAVENOUS

## 2019-06-02 NOTE — ED Notes (Signed)
Attempted to call pt's sister Carollee Herter, phone went to voicemail. Voicemail stated it was [employer].

## 2019-06-02 NOTE — Code Documentation (Signed)
Decision to monitor pt closely, [name] will stay at bedside.

## 2019-06-02 NOTE — ED Notes (Signed)
Pt still asleep in bed. Chest rise/fall observed and respirations are regular.Adam Cooper

## 2019-06-02 NOTE — ED Provider Notes (Signed)
-----------------------------------------   7:03 AM on 06/02/2019 -----------------------------------------  Blood pressure 124/87, pulse 96, temperature 97.6 F (36.4 C), temperature source Axillary, resp. rate 12, height 5\' 10"  (1.778 m), weight 74.8 kg, SpO2 97 %.  Assuming care from Dr. .  In short, Adam Cooper is a 44 y.o. male with a chief complaint of Drug Overdose .  Refer to the original H&P for additional details.  The current plan of care is to reassess for clinical sobriety with suspected overdose/intoxication.  ----------------------------------------- 1:04 PM on 06/02/2019 -----------------------------------------  Patient now awake and alert, acting appropriately.  He admits to drinking alcohol and taking Xanax last night but now appears clinically sober.  He was able to contact family for a ride home and is appropriate for discharge.   06/04/2019, MD 06/02/19 [phone]

## 2019-06-02 NOTE — ED Provider Notes (Signed)
[facility] Emergency Department Provider Note  ____________________________________________   First MD Initiated Contact with Patient 06/02/19 [phone]     (approximate)  I have reviewed the triage vital signs and the nursing notes.   HISTORY  Chief Complaint Drug Overdose  Level 5 caveat:  history/ROS limited by acute/critical illness  HPI Adam Cooper is a 44 y.o. male with unknown past medical history presents by EMS for altered mental status and probable drug overdose.  Family present on the scene told EMS that he has a history of occasional methamphetamine, benzodiazepine, and opioid abuse.  Tonight they found evidence that he had taken an unknown number of Xanax.  He was ambulatory and not expressing any suicidal ideation but then they heard him slumped over and he was unresponsive on the floor.  When EMS arrived he was breathing about 10 times a minute and they began bagging him after putting in a nasal trumpet.  He was unresponsive to painful stimuli.  They were bagging him when he arrived to the emergency department.   They also administered Narcan 4 mg intranasal with no response.        History reviewed. No pertinent past medical history.  There are no problems to display for this patient.   History reviewed. No pertinent surgical history.  Prior to Admission medications   Not on File    Allergies Patient has no allergy information on record.  History reviewed. No pertinent family history.  Social History Social History   Tobacco Use  . Smoking status: Unknown If Ever Smoked  Substance Use Topics  . Alcohol use: Not on file  . Drug use: Yes    Types: Methamphetamines    Comment: occasional benzo, meth, and opioid abuse according to family and reported to EMS    Review of Systems Level 5 caveat:  history/ROS limited by acute/critical illness  ____________________________________________   PHYSICAL EXAM:  VITAL  SIGNS: ED Triage Vitals  Enc Vitals Group     BP 06/02/19 0518 122/71     Pulse Rate 06/02/19 0518 (!) 111     Resp 06/02/19 0518 16     Temp 06/02/19 0525 97.6 F (36.4 C)     Temp Source 06/02/19 0524 Axillary     SpO2 06/02/19 0518 96 %     Weight 06/02/19 0525 74.8 kg (165 lb)     Height 06/02/19 0525 1.778 m (5\' 10" )     Head Circumference --      Peak Flow --      Pain Score 06/02/19 0524 Asleep     Pain Loc --      Pain Edu? --      Excl. in [place]? --     Constitutional: Obtunded. Eyes: Conjunctivae are normal.  Pupils were initially constricted according to EMS, for me they are small but not constricted and minimally responsive to light. Head: Atraumatic. Nose: No congestion/rhinnorhea. Mouth/Throat: Patient is wearing a mask. Neck: No stridor.  No meningeal signs.   Cardiovascular: Mild tachycardia, regular rhythm. Good peripheral circulation. Grossly normal heart sounds. Respiratory: Normal respiratory effort.  No retractions. Gastrointestinal: Soft and nontender. No distention.  Musculoskeletal: No lower extremity tenderness nor edema. No gross deformities of extremities. Neurologic: The patient has a GCS of 9 (Eye 2, Verbal 2, Motor 5).  He responded by localizing to sternal rub and other painful stimuli.  He has a decreased but present corneal reflex.  He is protecting his airway with a nasal  trumpet in place. Skin:  Skin is warm, dry and intact.   ____________________________________________   LABS (all labs ordered are listed, but only abnormal results are displayed)  Labs Reviewed  BASIC METABOLIC PANEL - Abnormal; Notable for the following components:      Result Value   Glucose, Bld 214 (*)    Calcium 8.6 (*)    All other components within normal limits  CBC WITH DIFFERENTIAL/PLATELET  URINE DRUG SCREEN, QUALITATIVE ([facility] ONLY)  ETHANOL   ____________________________________________  EKG  No indication for emergent  EKG ____________________________________________  RADIOLOGY Ursula Alert, personally viewed and evaluated these images (plain radiographs) as part of my medical decision making, as well as reviewing the written report by the radiologist.  ED MD interpretation:  No acute abnormalities identified on head CT nor cervical spine CT.  Official radiology report(s): CT Head Wo Contrast  Result Date: 06/02/2019 CLINICAL DATA:  Altered mental status.  Found unresponsive. EXAM: CT HEAD WITHOUT CONTRAST CT CERVICAL SPINE WITHOUT CONTRAST TECHNIQUE: Multidetector CT imaging of the head and cervical spine was performed following the standard protocol without intravenous contrast. Multiplanar CT image reconstructions of the cervical spine were also generated. COMPARISON:  08/06/2018 FINDINGS: CT HEAD FINDINGS Brain: No evidence of acute infarction, hemorrhage, hydrocephalus, extra-axial collection or mass lesion/mass effect. Vascular: No hyperdense vessel or unexpected calcification. Skull: Normal. Negative for fracture or focal lesion. Sinuses/Orbits: Negative CT CERVICAL SPINE FINDINGS Alignment: Normal Skull base and vertebrae: No acute fracture. Soft tissues and spinal canal: No prevertebral fluid or swelling. No visible canal hematoma. Disc levels: C6-7 disc degeneration with endplate sclerosis and spurring. Upper chest: Negative IMPRESSION: No acute intracranial or cervical spine finding. Electronically Signed   By: Monte Fantasia M.D.   On: 06/02/2019 06:39   CT Cervical Spine Wo Contrast  Result Date: 06/02/2019 CLINICAL DATA:  Altered mental status.  Found unresponsive. EXAM: CT HEAD WITHOUT CONTRAST CT CERVICAL SPINE WITHOUT CONTRAST TECHNIQUE: Multidetector CT imaging of the head and cervical spine was performed following the standard protocol without intravenous contrast. Multiplanar CT image reconstructions of the cervical spine were also generated. COMPARISON:  08/06/2018 FINDINGS: CT HEAD FINDINGS  Brain: No evidence of acute infarction, hemorrhage, hydrocephalus, extra-axial collection or mass lesion/mass effect. Vascular: No hyperdense vessel or unexpected calcification. Skull: Normal. Negative for fracture or focal lesion. Sinuses/Orbits: Negative CT CERVICAL SPINE FINDINGS Alignment: Normal Skull base and vertebrae: No acute fracture. Soft tissues and spinal canal: No prevertebral fluid or swelling. No visible canal hematoma. Disc levels: C6-7 disc degeneration with endplate sclerosis and spurring. Upper chest: Negative IMPRESSION: No acute intracranial or cervical spine finding. Electronically Signed   By: Monte Fantasia M.D.   On: 06/02/2019 06:39    ____________________________________________   PROCEDURES   Procedure(s) performed (including Critical Care):  .Critical Care Performed by: Hinda Kehr, MD Authorized by: Hinda Kehr, MD   Critical care provider statement:    Critical care time (minutes):  30   Critical care time was exclusive of:  Separately billable procedures and treating other patients   Critical care was necessary to treat or prevent imminent or life-threatening deterioration of the following conditions:  [place]   Critical care was time spent personally by me on the following activities:  Development of treatment plan with patient or surrogate, discussions with consultants, evaluation of patient's response to treatment, examination of patient, obtaining history from patient or surrogate, ordering and performing treatments and interventions, ordering and review of laboratory studies, ordering and review of radiographic  studies, pulse oximetry, re-evaluation of patient's condition and review of old charts     ____________________________________________   INITIAL IMPRESSION / MDM / ASSESSMENT AND PLAN / ED COURSE  As part of my medical decision making, I reviewed the following data within the electronic MEDICAL RECORD NUMBER Nursing notes reviewed and  incorporated, Labs reviewed , Old chart reviewed, Notes from prior ED visits and [place] Controlled Substance Database   Differential diagnosis includes, but is not limited to, accidental drug overdose, intentional drug overdose, alcohol intoxication, acute head injury with intracranial bleeding, CVA, acute infection.    Upon arrival to the emergency department he is breathing on his own and assisted with bag-valve-mask by EMS.  I requested that we stop bagging him and remove all oxygen and he is maintaining his oxygen saturation in the upper 90s without assistance with a nasal trumpet in place.  He responded briskly by localizing sternal rub and other painful stimuli but then will go back immediately to sleep.  I provided a small dose of Narcan 0.5 mg IV and there was no response.  He is right on the borderline of needing intubation but given that he is protecting his airway and not requiring any supplemental oxygen, I think the best thing to do is to watch him carefully with a sitter at bedside.  If he develops respiratory difficulties or develops hypoxemia I will reconsider intubation but at this point he does not need it.  Once he has been stable for period of time I will obtain a head CT and cervical spine CT given that there is a history of him falling and he is altered and I need to rule out traumatic brain and neck injury.  Lab work is pending.  I am giving 1 L normal saline IV fluids.      Clinical Course as of Jun 01 656  Wed Jun 02, 2019  0615 CBC and basic metabolic panel are within normal limits other than some hyperglycemia.   [CF]  [phone] The patient had been asleep earlier and speaking coherently with his nurse, he reported that he had a lot to drink last night and does not remember what else happened.  He was surprised that he was in the emergency department and absolutely denied suicidal ideation or any intentional overdose.  I went back to reassess him and he is back soundly asleep.  I  did not wake him up at this point because he clearly is not yet ready to go home, but I am comfortable with the plan for discharge once he is awake, alert, and sufficiently clinically sober to ambulate safely if someone can come pick him up.   [CF]    Clinical Course User Index [CF] Loleta Rose, MD     ____________________________________________  FINAL CLINICAL IMPRESSION(S) / ED DIAGNOSES  Final diagnoses:  Accidental overdose, initial encounter     MEDICATIONS GIVEN DURING THIS VISIT:  Medications  sodium chloride 0.9 % bolus 1,000 mL (1,000 mLs Intravenous New Bag/Given 06/02/19 0532)  naloxone [facility]) injection 0.5 mg (0.5 mg Intravenous Given 06/02/19 0518)     ED Discharge Orders    None      *Please note:  ONDRA DEBOARD was evaluated in Emergency Department on 06/02/2019 for the symptoms described in the history of present illness. He was evaluated in the context of the global COVID-19 pandemic, which necessitated consideration that the patient might be at risk for infection with the SARS-CoV-2 virus that causes COVID-19. Institutional protocols  and algorithms that pertain to the evaluation of patients at risk for COVID-19 are in a state of rapid change based on information released by regulatory bodies including the CDC and federal and state organizations. These policies and algorithms were followed during the patient's care in the ED.  Some ED evaluations and interventions may be delayed as a result of limited staffing during the pandemic.*  Note:  This document was prepared using Dragon voice recognition software and may include unintentional dictation errors.   Loleta Rose, MD 06/02/19 [phone]

## 2019-06-02 NOTE — Discharge Instructions (Addendum)
The combination of alcohol and/or other medications and drugs that you took last night almost killed you, or at a minimum, almost resulted in you ending up on a ventilator.  Please be careful with your alcohol consumption, particularly if you are drinking as well as taking Xanax or other benzodiazepines.  Avoid the use of illegal drugs completely.  Contact [facility] [place] or any of the other services listed in the include information if you would like help with substance abuse.  Return to the emergency department if you develop new or worsening symptoms that concern you, or if you develop any thoughts of harming yourself or anyone else.
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
## 2019-06-02 NOTE — ED Triage Notes (Signed)
Pt to ED via EMS from home. Per ems pt was found unresponsive on floor by family member, pt never lost pulse. Pt arrives to ED being bagged with BVM. Pt was given 4mg  intranasal narcan by ems with no change in pt status. Per family possible xanax OD. CBG 250. Pt responsive to sternal rub

## 2019-06-02 NOTE — ED Notes (Signed)
Attempted to call pts brother multiple times, phone continues to ring busy

## 2019-06-02 NOTE — ED Notes (Signed)
Call Carollee Herter (sister) for updates: [phone]

## 2019-06-02 NOTE — ED Notes (Signed)
Provided pt w/ phone to contact family/friends.

## 2019-06-02 NOTE — Code Documentation (Signed)
0.5mg  narcan given IV by Carollee Herter RN

## 2019-06-02 NOTE — Code Documentation (Signed)
Pt responsive to sternal rub, not to corneal reflex. Pts VSS room air sats 98%.

## 2019-06-02 NOTE — ED Notes (Signed)
Pt still resting in bed at this time. Chest rise/fall observed and respirations are regular.

## 2019-08-11 ENCOUNTER — Emergency Department
Admission: EM | Admit: 2019-08-11 | Discharge: 2019-08-12 | Disposition: A | Discharge status: Home / Self Care | Payer: Self-pay | Attending: Emergency Medicine | Admitting: Emergency Medicine

## 2019-08-11 ENCOUNTER — Emergency Department: Payer: Self-pay

## 2019-08-11 ENCOUNTER — Other Ambulatory Visit: Payer: Self-pay

## 2019-08-11 DIAGNOSIS — F17200 Nicotine dependence, unspecified, uncomplicated: Secondary | ICD-10-CM | POA: Insufficient documentation

## 2019-08-11 DIAGNOSIS — T50901A Poisoning by unspecified drugs, medicaments and biological substances, accidental (unintentional), initial encounter: Secondary | ICD-10-CM | POA: Insufficient documentation

## 2019-08-11 LAB — CBC
HCT: 36.5 % — ABNORMAL LOW (ref 39.0–52.0)
Hemoglobin: 12.3 g/dL — ABNORMAL LOW (ref 13.0–17.0)
MCH: 31.2 pg (ref 26.0–34.0)
MCHC: 33.7 g/dL (ref 30.0–36.0)
MCV: 92.6 fL (ref 80.0–100.0)
Platelets: 237 10*3/uL (ref 150–400)
RBC: 3.94 MIL/uL — ABNORMAL LOW (ref 4.22–5.81)
RDW: 12.4 % (ref 11.5–15.5)
WBC: 5.2 10*3/uL (ref 4.0–10.5)
nRBC: 0 % (ref 0.0–0.2)

## 2019-08-11 NOTE — ED Provider Notes (Signed)
[facility] Emergency Department Provider Note  ____________________________________________   First MD Initiated Contact with Patient 08/11/19 2333     (approximate)  I have reviewed the triage vital signs and the nursing notes.  History review of systems demented secondary to altered mental status.  History obtained primarily from EMS HISTORY  Chief Complaint Drug Overdose   HPI Adam Cooper is a 44 y.o. male with below list of previous medical conditions presents emergency department via EMS presumed "drug overdose".  EMS states that the patient was found unresponsive in his yard.  Reported history of alcohol and substance use disorder.  EMS states that the patient was given 4 mg of Narcan by fire department for their arrival and that was alert on their arrival but subsequently became unresponsive on arrival to the emergency department.  Patient now with snoring respiration, unresponsive to noxious stimuli     Past Medical History:  Diagnosis Date  . Anxiety     There are no problems to display for this patient.   History reviewed. No pertinent surgical history.  Prior to Admission medications   Medication Sig Start Date End Date Taking? Authorizing Provider  lidocaine (XYLOCAINE) 2 % solution Use as directed 10 mLs in the mouth or throat as needed for mouth pain. 10/31/16   Laban Emperor, PA-C  LORazepam (ATIVAN) 0.5 MG tablet Take 1 tablet (0.5 mg total) by mouth 2 (two) times daily as needed for anxiety. 05/29/15   Hagler, Jami L, PA-C  magic mouthwash w/lidocaine SOLN Take 5 mLs by mouth 4 (four) times daily as needed for mouth pain. 05/29/15   Hagler, Jami L, PA-C  predniSONE (DELTASONE) 10 MG tablet Take 20mg  (2 tablets) on 8/17. Take 10mg  (1 tablet) on 8/18 10/31/16   Laban Emperor, PA-C  sulfamethoxazole-trimethoprim (BACTRIM DS,SEPTRA DS) 800-160 MG tablet Take 1 tablet by mouth 2 (two) times daily. 10/27/16   Victorino Dike, FNP     Allergies Patient has no known allergies.  No family history on file.  Social History Social History   Tobacco Use  . Smoking status: Current Every Day Smoker  . Smokeless tobacco: Never Used  Substance Use Topics  . Alcohol use: Yes  . Drug use: Yes    Types: Methamphetamines    Comment: occasional benzo, meth, and opioid abuse according to family and reported to EMS    Review of Systems Constitutional: No fever/chills Eyes: No visual changes. ENT: No sore throat. Cardiovascular: Denies chest pain. Respiratory: Denies shortness of breath. Gastrointestinal: No abdominal pain.  No nausea, no vomiting.  No diarrhea.  No constipation. Genitourinary: Negative for dysuria. Musculoskeletal: Negative for neck pain.  Negative for back pain. Integumentary: Negative for rash. Neurological: Negative for headaches, focal weakness or numbness. Psychiatric:   ____________________________________________   PHYSICAL EXAM:  VITAL SIGNS: ED Triage Vitals  Enc Vitals Group     BP      Pulse      Resp      Temp      Temp src      SpO2      Weight      Height      Head Circumference      Peak Flow      Pain Score      Pain Loc      Pain Edu?      Excl. in [place]?     Constitutional: Unresponsive to verbal stimuli minimally responsive to noxious stimuli Eyes: Conjunctivae  are normal.  Head: Atraumatic. Mouth/Throat: Positive gag reflex Neck: No stridor.  No meningeal signs.   Cardiovascular: Normal rate, regular rhythm. Good peripheral circulation. Grossly normal heart sounds. Respiratory: Normal respiratory effort.  No retractions. Gastrointestinal: Soft and nontender. No distention.  Musculoskeletal: No lower extremity tenderness nor edema. No gross deformities of extremities. Neurologic:  Normal speech and language. No gross focal neurologic deficits are appreciated.  Skin:  Skin is warm, dry and intact. Psychiatric: Mood and affect are normal. Speech and behavior are  normal.  ____________________________________________   LABS (all labs ordered are listed, but only abnormal results are displayed)  Labs Reviewed  CBC - Abnormal; Notable for the following components:      Result Value   RBC 3.94 (*)    Hemoglobin 12.3 (*)    HCT 36.5 (*)    All other components within normal limits  COMPREHENSIVE METABOLIC PANEL - Abnormal; Notable for the following components:   Potassium 3.2 (*)    Glucose, Bld 127 (*)    Calcium 8.7 (*)    All other components within normal limits  ETHANOL - Abnormal; Notable for the following components:   Alcohol, Ethyl (B) 169 (*)    All other components within normal limits  URINE DRUG SCREEN, QUALITATIVE ([facility] ONLY) - Abnormal; Notable for the following components:   Amphetamines, Ur Screen POSITIVE (*)    Benzodiazepine, Ur Scrn POSITIVE (*)    All other components within normal limits  TROPONIN I (HIGH SENSITIVITY)  TROPONIN I (HIGH SENSITIVITY)   ____________________________________________  EKG  ED ECG REPORT I, Drake N Kayl Stogdill, the attending physician, personally viewed and interpreted this ECG.   Date: 08/11/2019  EKG Time: 7:07 PM  Rate: 82  Rhythm: Normal sinus rhythm  Axis: Normal  intervals: Normal  ST&T Change: None  ____________________________________________  RADIOLOGY I,  N Sailor Haughn, personally viewed and evaluated these images (plain radiographs) as part of my medical decision making, as well as reviewing the written report by the radiologist.  ED MD interpretation: No gross intracranial abnormality on CT head per radiologist.  Official radiology report(s): CT Head Wo Contrast  Result Date: 08/12/2019 CLINICAL DATA:  Altered mental status, possible overdose EXAM: CT HEAD WITHOUT CONTRAST TECHNIQUE: Contiguous axial images were obtained from the base of the skull through the vertex without intravenous contrast. COMPARISON:  CT 06/02/2019 FINDINGS: Brain: Imaging quality is severely  degraded secondary to motion artifact despite multiple attempts at acquisition. No gross intracranial abnormality is evident. No large vascular territory or cortically based infarcts. No visible hemorrhage. No mass effect or midline shift. Slight asymmetry of ventricles is stable from prior and likely normal for this patient. The posterior fossa is nearly completely obscured. Vascular: No visible hyperdense vessel or unexpected calcification. Skull: No visible calvarial fracture or suspicious osseous lesion. No scalp swelling or hematoma. Small cutaneous nodules noted in the midline scalp, correlate with visual inspection. Sinuses/Orbits: Paranasal sinuses are predominantly clear. No gross orbital abnormality. Other: None. IMPRESSION: 1. Imaging quality is severely degraded secondary to motion artifact despite multiple attempts at acquisition. 2. No gross intracranial abnormality is evident. Electronically Signed   By: Kreg Shropshire M.D.   On: 08/12/2019 00:37     Procedures   ____________________________________________   INITIAL IMPRESSION / MDM / ASSESSMENT AND PLAN / ED COURSE  As part of my medical decision making, I reviewed the following data within the electronic MEDICAL RECORD NUMBER  44 year old male presented with above-stated history and physical exam consistent with  possible drug overdose.  Patient is now alert awake and oriented x4.  Patient stating that he needs to go to work this morning.  Patient does admit to illicit drug use last night however denies any suicidal or homicidal ideation.  ____________________________________________  FINAL CLINICAL IMPRESSION(S) / ED DIAGNOSES  Final diagnoses:  Accidental drug overdose, initial encounter     MEDICATIONS GIVEN DURING THIS VISIT:  Medications - No data to display   ED Discharge Orders    None      *Please note:  RINGO SHEROD was evaluated in Emergency Department on 08/12/2019 for the symptoms described in the history  of present illness. He was evaluated in the context of the global COVID-19 pandemic, which necessitated consideration that the patient might be at risk for infection with the SARS-CoV-2 virus that causes COVID-19. Institutional protocols and algorithms that pertain to the evaluation of patients at risk for COVID-19 are in a state of rapid change based on information released by regulatory bodies including the CDC and federal and state organizations. These policies and algorithms were followed during the patient's care in the ED.  Some ED evaluations and interventions may be delayed as a result of limited staffing during the pandemic.*  Note:  This document was prepared using Dragon voice recognition software and may include unintentional dictation errors.   Darci Current, MD 08/12/19 [phone]

## 2019-08-11 NOTE — ED Triage Notes (Signed)
Pt to the er for possible overdose. Pt was found unresponsive in the yard by family. Pt has ETOH and narcotics. Pt was given narcan on scene with a positive response and then went back to sleep. On arrival to the ED, pt does not respond to painful stimuli, does not have a gag and sats in the 60s. Pt given 1mg  of narcan with no response. Pt does not have a gag reflex. Pt placed on non rebreather with no improvement. Dr paged to room as this RN begins to bag.

## 2019-08-12 LAB — COMPREHENSIVE METABOLIC PANEL
ALT: 15 U/L (ref 0–44)
AST: 22 U/L (ref 15–41)
Albumin: 3.8 g/dL (ref 3.5–5.0)
Alkaline Phosphatase: 58 U/L (ref 38–126)
Anion gap: 10 (ref 5–15)
BUN: 12 mg/dL (ref 6–20)
CO2: 25 mmol/L (ref 22–32)
Calcium: 8.7 mg/dL — ABNORMAL LOW (ref 8.9–10.3)
Chloride: 105 mmol/L (ref 98–111)
Creatinine, Ser: 0.7 mg/dL (ref 0.61–1.24)
GFR calc Af Amer: >60 mL/min (ref >60)
GFR calc non Af Amer: >60 mL/min (ref >60)
Glucose, Bld: 127 mg/dL — ABNORMAL HIGH (ref 70–99)
Potassium: 3.2 mmol/L — ABNORMAL LOW (ref 3.5–5.1)
Sodium: 140 mmol/L (ref 135–145)
Total Bilirubin: 0.6 mg/dL (ref 0.3–1.2)
Total Protein: 6.9 g/dL (ref 6.5–8.1)

## 2019-08-12 LAB — URINE DRUG SCREEN, QUALITATIVE (ARMC ONLY)
Amphetamines, Ur Screen: POSITIVE — AB
Barbiturates, Ur Screen: NOT DETECTED
Benzodiazepine, Ur Scrn: POSITIVE — AB
Cannabinoid 50 Ng, Ur ~~LOC~~: NOT DETECTED
Cocaine Metabolite,Ur ~~LOC~~: NOT DETECTED
MDMA (Ecstasy)Ur Screen: NOT DETECTED
Methadone Scn, Ur: NOT DETECTED
Opiate, Ur Screen: NOT DETECTED
Phencyclidine (PCP) Ur S: NOT DETECTED
Tricyclic, Ur Screen: NOT DETECTED

## 2019-08-12 LAB — ETHANOL: Alcohol, Ethyl (B): 169 mg/dL — ABNORMAL HIGH (ref <10)

## 2019-08-12 LAB — TROPONIN I (HIGH SENSITIVITY): Troponin I (High Sensitivity): <2 ng/L (ref <18)

## 2019-08-12 NOTE — ED Notes (Signed)
Pt moved to the hallway as he is a fall risk and keeps getting out of bed. Pt moved to the area in front of the nurses station.

## 2019-08-12 NOTE — ED Notes (Signed)
Spoke with family. They are otw. Pt back to sleep.

## 2019-08-12 NOTE — ED Notes (Signed)
Pt states he is about to freeze. Offerred pt a blanket. Pt stated he wants to go home. No answer to family.

## 2019-08-12 NOTE — ED Notes (Signed)
Pt denies SI and HI. Pt family is on the way to pick him up.

## 2019-08-12 NOTE — ED Notes (Signed)
Pt keeps asking to call family. Multiple numbers tried but no answer. Pt asking to pee. Pt has been reminded of urinal in his hands multiple times. Pt is more alert but still incoherent.

## 2019-08-12 NOTE — ED Notes (Signed)
Nephew given d/c instructions and clothing for pt. Pt moved to the bathroom to get dressed by stretcher. Wheelchair left at door for pt. Nephew in with pt.

## 2022-01-30 IMAGING — CT CT HEAD W/O CM
3 of 4 series · 16 of 47 positions shown, 19 images · non-contrast
Comparison: 08/06/2018

CLINICAL DATA: Altered mental status.  Found unresponsive.

EXAM:
CT HEAD WITHOUT CONTRAST
CT CERVICAL SPINE WITHOUT CONTRAST
TECHNIQUE: Multidetector CT imaging of the head and cervical spine was
performed following the standard protocol without intravenous
contrast. Multiplanar CT image reconstructions of the cervical spine
were also generated.

[Series 2: head wo · axial · 0.42mm/px · z∈[-124,-4]mm · 10 of 30 slices shown, 13 images]
[im 3/30  brain]
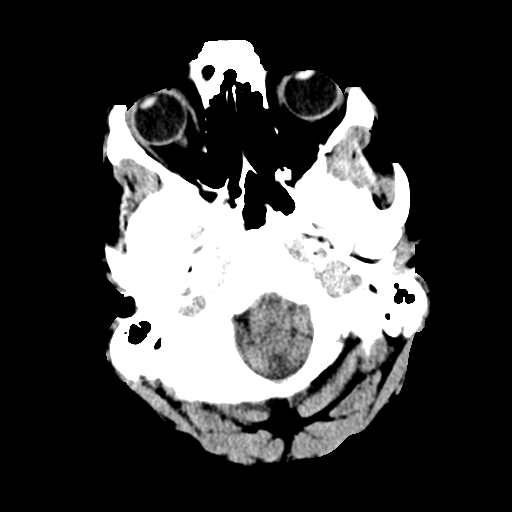
[im 3/30  bone]
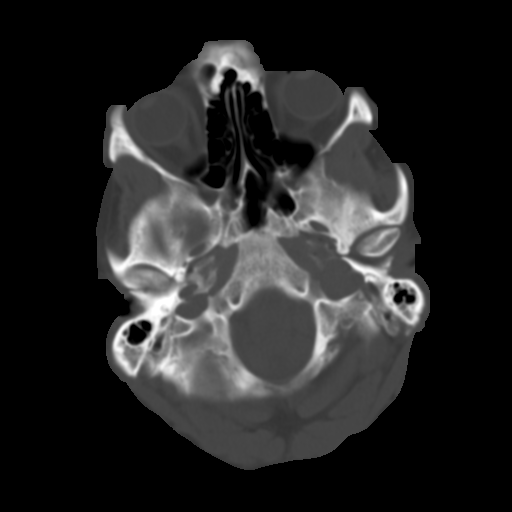
[im 5/30  brain]
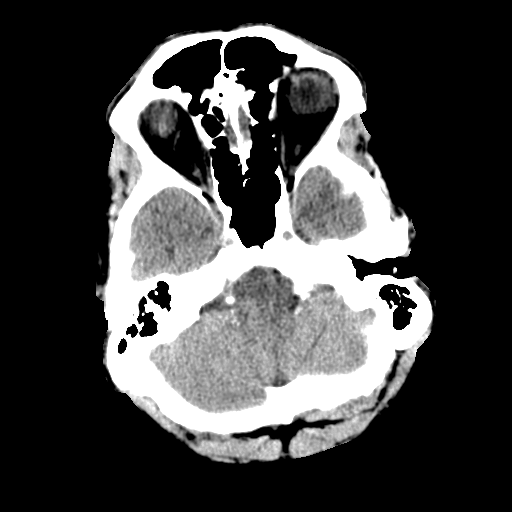
[im 9/30  brain]
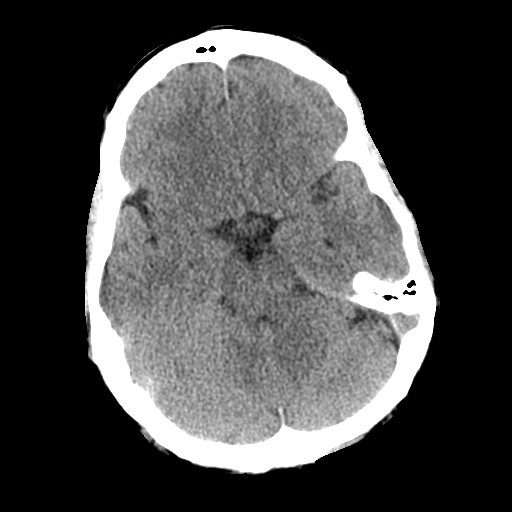
[im 11/30  brain]
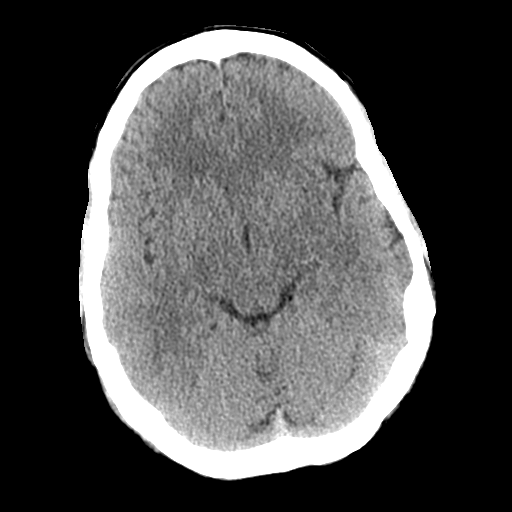
[im 13/30  brain]
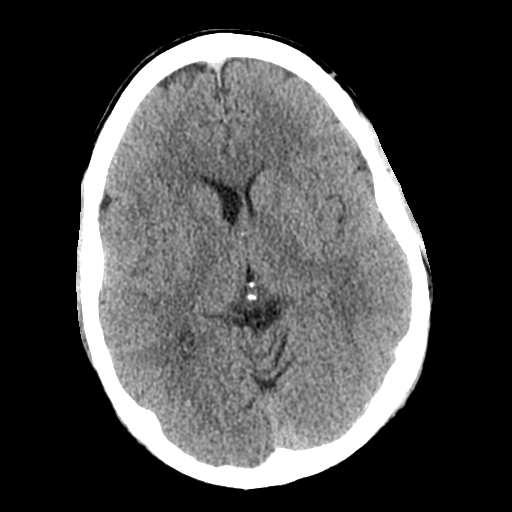
[im 13/30  bone]
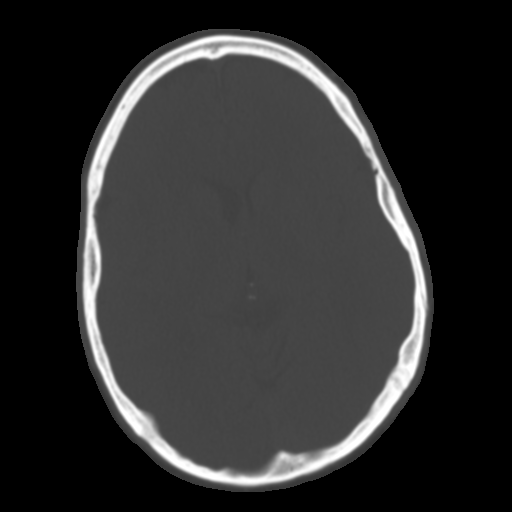
[im 17/30  brain]
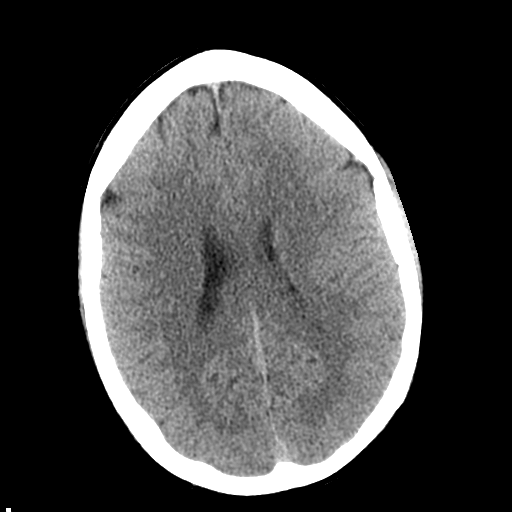
[im 19/30  brain]
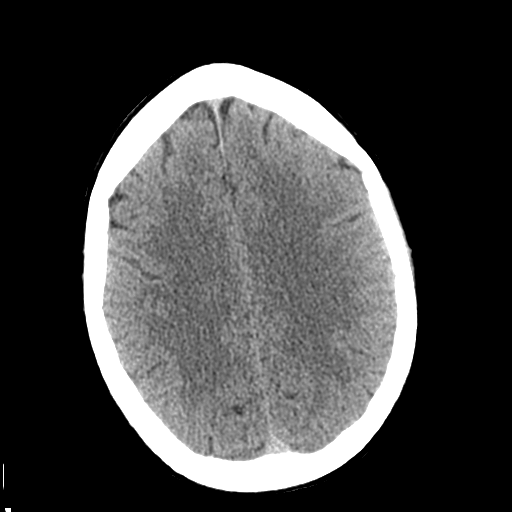
[im 21/30  brain]
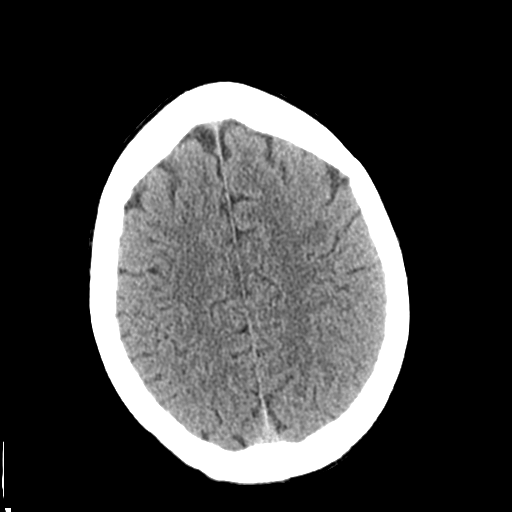
[im 25/30  brain]
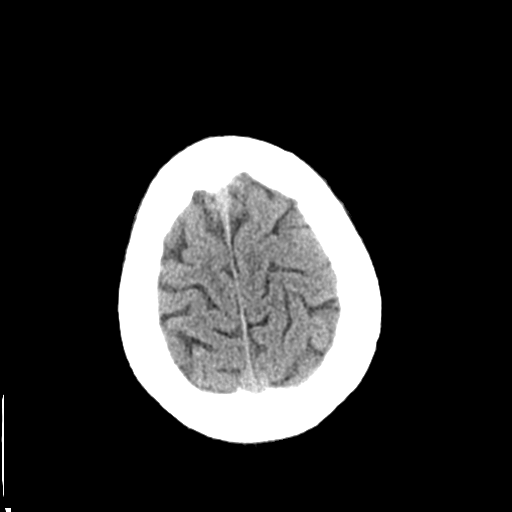
[im 25/30  bone]
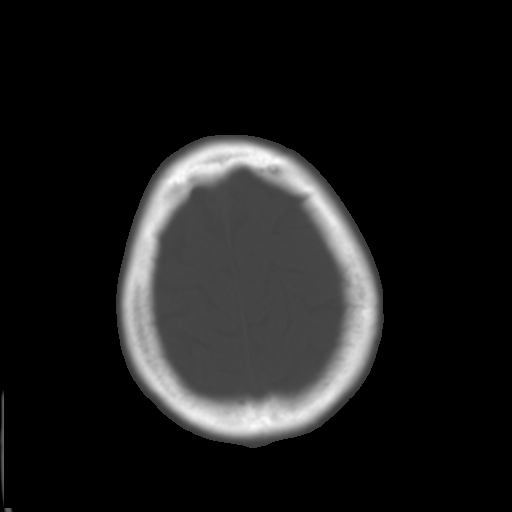
[im 27/30  brain]
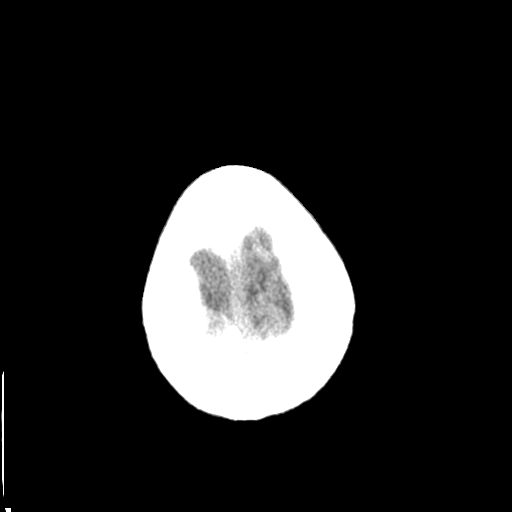

[Series 4: coronal soft tissue · coronal · 0.32mm/px · 3 of 67 slices shown]
[im 23/67  brain]
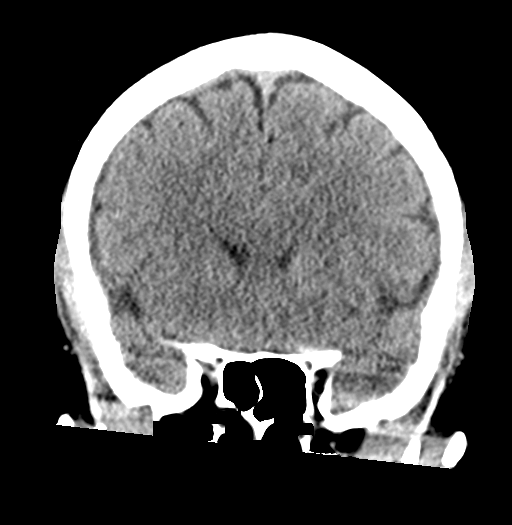
[im 30/67  brain]
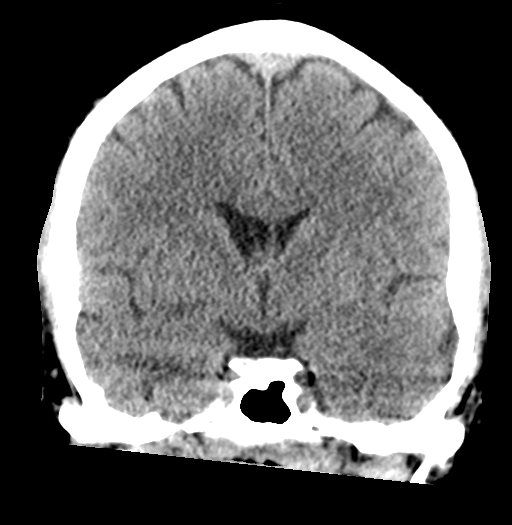
[im 37/67  brain]
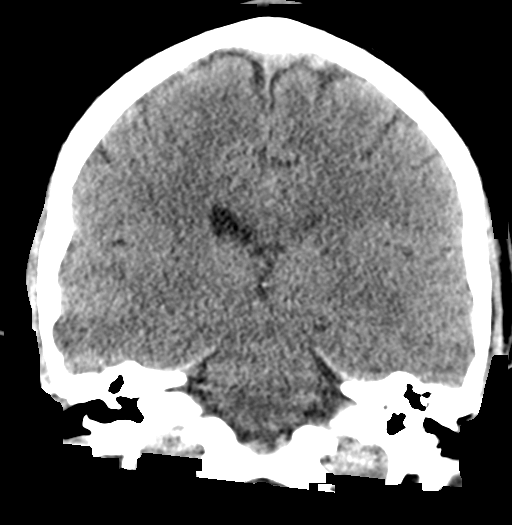

[Series 5: sagittal soft tissue · sagittal · 0.35mm/px · 3 of 52 slices shown]
[im 18/52  brain]
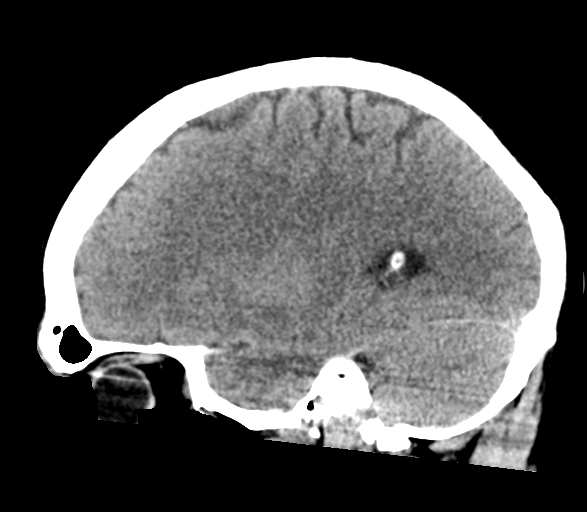
[im 26/52  brain]
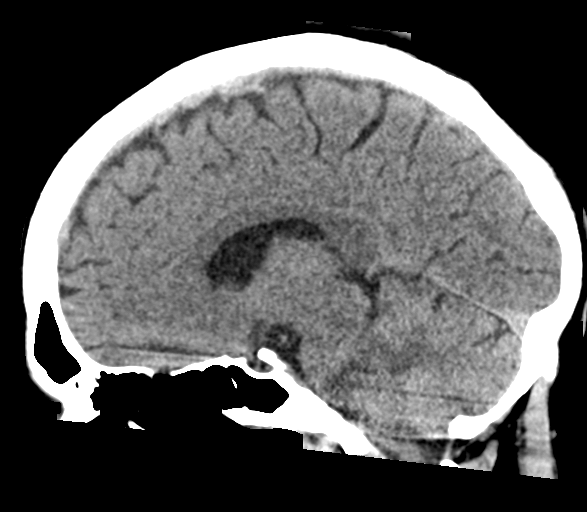
[im 35/52  brain]
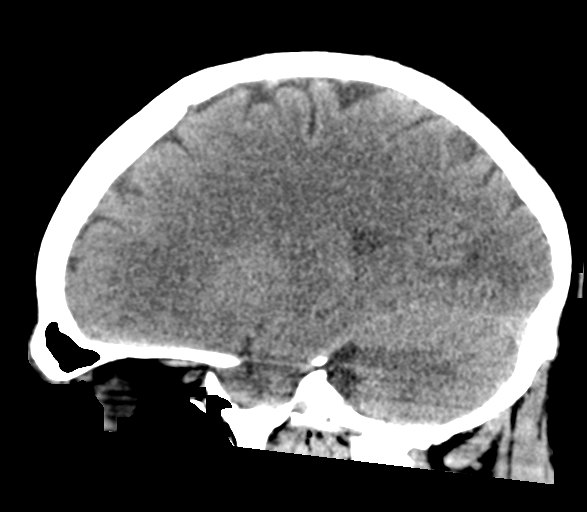

[16 of 47 positions shown; findings below may reference images not displayed]

FINDINGS: CT HEAD FINDINGS

Brain: No evidence of acute infarction, hemorrhage, hydrocephalus,
extra-axial collection or mass lesion/mass effect.

Vascular: No hyperdense vessel or unexpected calcification.

Skull: Normal. Negative for fracture or focal lesion.

Sinuses/Orbits: Negative

CT CERVICAL SPINE FINDINGS

Alignment: Normal

Skull base and vertebrae: No acute fracture.

Soft tissues and spinal canal: No prevertebral fluid or swelling. No
visible canal hematoma.

Disc levels: C6-7 disc degeneration with endplate sclerosis and
spurring.

Upper chest: Negative
IMPRESSION: No acute intracranial or cervical spine finding.
